# Patient Record
Sex: Male | Born: 1980 | Race: White | Hispanic: No | Marital: Single | Smoking: Current every day smoker
Health system: Southern US, Community
[De-identification: ages and names within clinical notes are randomized; demographics above are authoritative.]

## PROBLEM LIST (undated history)

## (undated) DIAGNOSIS — F419 Anxiety disorder, unspecified: Secondary | ICD-10-CM

## (undated) DIAGNOSIS — F329 Major depressive disorder, single episode, unspecified: Secondary | ICD-10-CM

## (undated) DIAGNOSIS — F32A Depression, unspecified: Secondary | ICD-10-CM

## (undated) HISTORY — PX: SEPTOPLASTY: SUR1290

## (undated) HISTORY — PX: APPENDECTOMY: SHX54

## (undated) HISTORY — PX: RHINOPLASTY: SUR1284

---

## 2017-12-23 ENCOUNTER — Emergency Department (HOSPITAL_COMMUNITY)
Admission: EM | Admit: 2017-12-23 | Discharge: 2017-12-24 | Disposition: A | Discharge status: Home / Self Care | Payer: 59 | Attending: Emergency Medicine | Admitting: Emergency Medicine

## 2017-12-23 ENCOUNTER — Other Ambulatory Visit: Payer: Self-pay

## 2017-12-23 ENCOUNTER — Emergency Department (HOSPITAL_COMMUNITY): Payer: 59

## 2017-12-23 ENCOUNTER — Encounter (HOSPITAL_COMMUNITY): Payer: Self-pay | Admitting: Emergency Medicine

## 2017-12-23 DIAGNOSIS — E876 Hypokalemia: Secondary | ICD-10-CM

## 2017-12-23 DIAGNOSIS — R55 Syncope and collapse: Secondary | ICD-10-CM | POA: Insufficient documentation

## 2017-12-23 DIAGNOSIS — R0781 Pleurodynia: Secondary | ICD-10-CM | POA: Diagnosis not present

## 2017-12-23 DIAGNOSIS — F17210 Nicotine dependence, cigarettes, uncomplicated: Secondary | ICD-10-CM | POA: Insufficient documentation

## 2017-12-23 HISTORY — DX: Major depressive disorder, single episode, unspecified: F32.9

## 2017-12-23 HISTORY — DX: Anxiety disorder, unspecified: F41.9

## 2017-12-23 HISTORY — DX: Depression, unspecified: F32.A

## 2017-12-23 LAB — CBC WITH DIFFERENTIAL/PLATELET
ABS IMMATURE GRANULOCYTES: 0.06 10*3/uL (ref 0.00–0.07)
BASOS ABS: 0 10*3/uL (ref 0.0–0.1)
Basophils Relative: 0 %
EOS ABS: 0.1 10*3/uL (ref 0.0–0.5)
Eosinophils Relative: 1 %
HEMATOCRIT: 44.7 % (ref 39.0–52.0)
HEMOGLOBIN: 15 g/dL (ref 13.0–17.0)
IMMATURE GRANULOCYTES: 1 %
LYMPHS ABS: 3 10*3/uL (ref 0.7–4.0)
LYMPHS PCT: 29 %
MCH: 29.1 pg (ref 26.0–34.0)
MCHC: 33.6 g/dL (ref 30.0–36.0)
MCV: 86.8 fL (ref 80.0–100.0)
MONOS PCT: 5 %
Monocytes Absolute: 0.5 10*3/uL (ref 0.1–1.0)
NEUTROS ABS: 6.8 10*3/uL (ref 1.7–7.7)
NEUTROS PCT: 64 %
NRBC: 0 % (ref 0.0–0.2)
Platelets: 320 10*3/uL (ref 150–400)
RBC: 5.15 MIL/uL (ref 4.22–5.81)
RDW: 12.5 % (ref 11.5–15.5)
WBC: 10.5 10*3/uL (ref 4.0–10.5)

## 2017-12-23 LAB — BASIC METABOLIC PANEL
ANION GAP: 10 (ref 5–15)
BUN: 12 mg/dL (ref 6–20)
CALCIUM: 9.2 mg/dL (ref 8.9–10.3)
CO2: 22 mmol/L (ref 22–32)
Chloride: 102 mmol/L (ref 98–111)
Creatinine, Ser: 1.16 mg/dL (ref 0.61–1.24)
GFR calc non Af Amer: >60 mL/min (ref >60)
GLUCOSE: 102 mg/dL — AB (ref 70–99)
POTASSIUM: 2.8 mmol/L — AB (ref 3.5–5.1)
Sodium: 134 mmol/L — ABNORMAL LOW (ref 135–145)

## 2017-12-23 NOTE — ED Provider Notes (Signed)
Patient placed in Quick Look pathway, seen and evaluated   Chief Complaint: shortness of breath, near syncopal episode  HPI:  Geoffrey Russell is a 37 y.o. male who presents to the ED with shortness of breath that occurred while at work just prior to arrival to the ED. Patient reports that when leaving work he felt like he was going to pass out. Patient has had URI and was evaluated at [facility] a few days ago. Patient has been taking sudafed and using an inhaler.  ROS: Resp: shortness of breath  Neuro: dizziness  CV: chest pain Physical Exam:  BP (!) 158/108 (BP Location: Right Arm)   Pulse 76   Temp 98.2 F (36.8 C) (Oral)   Resp 20   Ht 5\' 10"  (1.778 m)   Wt 93 kg   SpO2 100%   BMI 29.41 kg/m    Gen: No distress  Neuro: Awake and Alert  Skin: Warm and dry  Lungs: no wheezing or rales  Heart: regular rate and rhythm   Initiation of care has begun. The patient has been counseled on the process, plan, and necessity for staying for the completion/evaluation, and the remainder of the medical screening examination    Janne Napoleon, NP 12/23/17 6213    Gwyneth Sprout, MD 12/24/17 1429

## 2017-12-23 NOTE — ED Triage Notes (Signed)
Patient with chest tightness and shortness of breath that started as he was leaving work today.  Patient states that he felt like he was going to pass out at one point after using his inhaler.  Patient recently treated for URI at an [facility]. [facility]).

## 2017-12-24 ENCOUNTER — Emergency Department (HOSPITAL_COMMUNITY): Payer: 59

## 2017-12-24 DIAGNOSIS — R55 Syncope and collapse: Secondary | ICD-10-CM | POA: Diagnosis not present

## 2017-12-24 LAB — TROPONIN I

## 2017-12-24 LAB — D-DIMER, QUANTITATIVE (NOT AT ARMC)

## 2017-12-24 MED ORDER — FLUTICASONE PROPIONATE 50 MCG/ACT NA SUSP: 2.0000 | Freq: Every day | NASAL | 0 refills | Status: AC

## 2017-12-24 MED ORDER — POTASSIUM CHLORIDE CRYS ER 20 MEQ PO TBCR: 20.0000 meq | EXTENDED_RELEASE_TABLET | Freq: Two times a day (BID) | ORAL | 0 refills | Status: AC

## 2017-12-24 MED ORDER — POTASSIUM CHLORIDE CRYS ER 20 MEQ PO TBCR
40.0000 meq | EXTENDED_RELEASE_TABLET | Freq: Once | ORAL | Status: AC
  Administered 2017-12-24: 40 meq via ORAL
  Filled 2017-12-24: qty 2

## 2017-12-24 MED ORDER — SODIUM CHLORIDE 0.9 % IV BOLUS
1000.0000 mL | Freq: Once | INTRAVENOUS | Status: AC
  Administered 2017-12-24: 1000 mL via INTRAVENOUS

## 2017-12-24 MED ORDER — IOPAMIDOL (ISOVUE-370) INJECTION 76%
100.0000 mL | Freq: Once | INTRAVENOUS | Status: AC | PRN
  Administered 2017-12-24: 75 mL via INTRAVENOUS

## 2017-12-24 NOTE — ED Notes (Signed)
ED Provider at bedside. 

## 2017-12-24 NOTE — ED Provider Notes (Signed)
[facility] EMERGENCY DEPARTMENT Provider Note   CSN: [id number] Arrival date & time: 12/23/17  1934     History   Chief Complaint Chief Complaint  Patient presents with  . Shortness of Breath  . Near Syncope    HPI Geoffrey Russell is a 37 y.o. male.  37 year old with no past medical history presents with episode of near syncope happened after he got off work around 7 PM.  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  States he might of inhaled some fumes today.  He did not eat and drink very well today.  He has been sick with a URI and was diagnosed with this at urgent care about 5 days ago.  He was treated with albuterol and prednisone which she has finished and is still taking Sudafed.  He does have a history of smoking but no diagnosis of asthma.  Admits to poor p.o. intake without vomiting or diarrhea.  States when he tried to get up he felt his heart racing and felt like he is going to pass out and had a sit back down.  This happened twice.  He does have pleuritic chest pain with breathing and nonproductive cough.  No fever.  He admits that he has not been drinking very much fluids over the past several days.  He did not get hot or sweaty.  Did not feel like he was going throw up.  He states he had blurry vision and racing heart and lightheadedness and had to sit down. He is anxious because his fiance died in this hospital 6 months ago.  The history is provided by the patient.  Shortness of Breath  Associated symptoms include rhinorrhea. Pertinent negatives include no fever, no headaches, no cough, no chest pain, no vomiting, no abdominal pain and no rash.  Near Syncope  Associated symptoms include shortness of breath. Pertinent negatives include no chest pain, no abdominal pain and no headaches.    Past Medical History:  Diagnosis Date  . Anxiety   . Depression     There are no active  problems to display for this patient.   Past Surgical History:  Procedure Laterality Date  . APPENDECTOMY    . RHINOPLASTY    . SEPTOPLASTY          Home Medications    Prior to Admission medications   Medication Sig Start Date End Date Taking? Authorizing Provider  brompheniramine-pseudoephedrine-DM 30-2-10 MG/5ML syrup Take 10 mLs by mouth every 6 (six) hours as needed (for cough).  12/18/17  Yes [provider]  predniSONE (DELTASONE) 20 MG tablet Take 40 mg by mouth daily. 12/18/17  Yes [provider]  PROAIR HFA 108 (90 Base) MCG/ACT inhaler Inhale 1-2 puffs into the lungs every 4 (four) hours as needed for wheezing.  12/18/17  Yes [provider]    Family History No family history on file.  Social History Social History   Tobacco Use  . Smoking status: Current Every Day Smoker  . Smokeless tobacco: Never Used  Substance Use Topics  . Alcohol use: Not on file  . Drug use: Never     Allergies   Patient has no known allergies.   Review of Systems Review of Systems  Constitutional: Negative for activity change, appetite change, fatigue and fever.  HENT: Positive for congestion and rhinorrhea. Negative for trouble swallowing.   Respiratory: Positive for chest tightness and shortness of breath. Negative  for cough.   Cardiovascular: Positive for near-syncope. Negative for chest pain.  Gastrointestinal: Positive for nausea. Negative for abdominal pain and vomiting.  Genitourinary: Negative for decreased urine volume, dysuria and hematuria.  Musculoskeletal: Negative for arthralgias and myalgias.  Skin: Negative for rash.  Neurological: Positive for dizziness and light-headedness. Negative for weakness and headaches.   all other systems are negative except as noted in the HPI and PMH.     Physical Exam Updated Vital Signs BP 135/90 (BP Location: Right Arm)   Pulse (!) 53   Temp 97.6 F (36.4 C) (Oral)   Resp 16   Ht 5\' 10"   (1.778 m)   Wt 93 kg   SpO2 100%   BMI 29.41 kg/m   Physical Exam  Constitutional: He is oriented to person, place, and time. He appears well-developed and well-nourished. No distress.  Appears anxious No respiratory distress  HENT:  Head: Normocephalic and atraumatic.  Mouth/Throat: Oropharynx is clear and moist. No oropharyngeal exudate.  Eyes: Pupils are equal, round, and reactive to light. Conjunctivae and EOM are normal.  Neck: Normal range of motion. Neck supple.  No meningismus.  Cardiovascular: Normal rate, regular rhythm, normal heart sounds and intact distal pulses.  No murmur heard. Pulmonary/Chest: Effort normal and breath sounds normal. No respiratory distress. He has no wheezes.  Abdominal: Soft. There is no tenderness. There is no rebound and no guarding.  Musculoskeletal: Normal range of motion. He exhibits no edema or tenderness.  Neurological: He is alert and oriented to person, place, and time. No cranial nerve deficit. He exhibits normal muscle tone. Coordination normal.   5/5 strength throughout. CN 2-12 intact.Equal grip strength.   Skin: Skin is warm.  Psychiatric: He has a normal mood and affect. His behavior is normal.  Nursing note and vitals reviewed.    ED Treatments / Results  Labs (all labs ordered are listed, but only abnormal results are displayed) Labs Reviewed  BASIC METABOLIC PANEL - Abnormal; Notable for the following components:      Result Value   Sodium 134 (*)    Potassium 2.8 (*)    Glucose, Bld 102 (*)    All other components within normal limits  CBC WITH DIFFERENTIAL/PLATELET  D-DIMER, QUANTITATIVE (NOT AT [facility])  TROPONIN I    EKG EKG Interpretation  Date/Time:  Monday December 23 2017 19:38:13 EDT Ventricular Rate:  80 PR Interval:  140 QRS Duration: 88 QT Interval:  368 QTC Calculation: 424 R Axis:   53 Text Interpretation:  Normal sinus rhythm Normal ECG No old tracing to compare Confirmed by Glynn Octave [phone])  on 12/24/2017 4:13:16 AM   Radiology Dg Chest 2 View  Result Date: 12/23/2017 CLINICAL DATA:  Shortness of breath EXAM: CHEST - 2 VIEW COMPARISON:  07/31/2016 FINDINGS: Heart and mediastinal contours are within normal limits. No focal opacities or effusions. No acute bony abnormality. IMPRESSION: No active cardiopulmonary disease. Electronically Signed   By: Charlett Nose M.D.   On: 12/23/2017 20:12    Procedures Procedures (including critical care time)  Medications Ordered in ED Medications  potassium chloride SA (K-DUR,KLOR-CON) CR tablet 40 mEq (has no administration in time range)     Initial Impression / Assessment and Plan / ED Course  I have reviewed the triage vital signs and the nursing notes.  Pertinent labs & imaging results that were available during my care of the patient were reviewed by me and considered in my medical decision making (see chart for details).  Near syncope with pleuritic chest pain and shortness of breath.  No hypoxia, clear lungs.  EKG shows sinus rhythm, no Brugada, no prolonged QT.  Chest x-ray is negative.  He is not wheezing on exam.  We will check orthostatics, p.o. hydration, d-dimer given pleuritic chest pain  Orthostatics negative.  Patient given p.o. and IV hydration.  Troponin d-dimer negative.  Low suspicion for ACS.  Given patient's anxiety as well as ongoing pleuritic chest pain will obtain CT scan to fully rule out pulmonary embolism.  Patient will be treated supportively for likely viral URI with pleuritic type pain.  His near syncope is likely due to his poor p.o. intake as well as fume exposure as well as recent sympathomimetic use.  Patient transferred to Dr. Rodena Medin at shift change. Final Clinical Impressions(s) / ED Diagnoses   Final diagnoses:  Near syncope    ED Discharge Orders    None       Robben Jagiello, Jeannett Senior, MD 12/24/17 [phone]

## 2017-12-24 NOTE — Discharge Instructions (Addendum)
There is no evidence of pneumonia or blood clot in the lung. Keep yourself hydrated.  Follow-up with your primary doctor.  Return to the ED if you develop chest pain, shortness of breath, any other concerns.

## 2019-05-10 IMAGING — CT CT ANGIO CHEST
2 of 10 series · 16 of 46 positions shown · IV contrast (APPLIED)
Comparison: Chest radiograph December 23, 2017

CLINICAL DATA: Shortness of breath

EXAM:
CT ANGIOGRAPHY CHEST WITH CONTRAST
TECHNIQUE: Multidetector CT imaging of the chest was performed using the
standard protocol during bolus administration of intravenous
contrast. Multiplanar CT image reconstructions and MIPs were
obtained to evaluate the vascular anatomy.
CONTRAST:  75 mL IXVLLG-5OE IOPAMIDOL (IXVLLG-5OE) INJECTION 76%

[Series 8: thins · axial · 0.72mm/px · z∈[+1212,+1430]mm · 13 of 366 slices shown]
[im 27/366  lung]
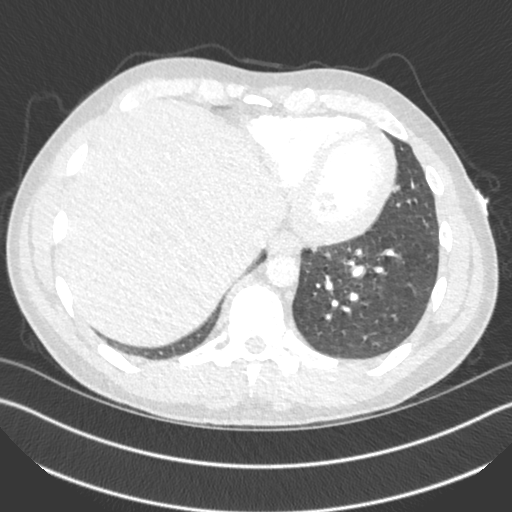
[im 53/366  soft-tissue]
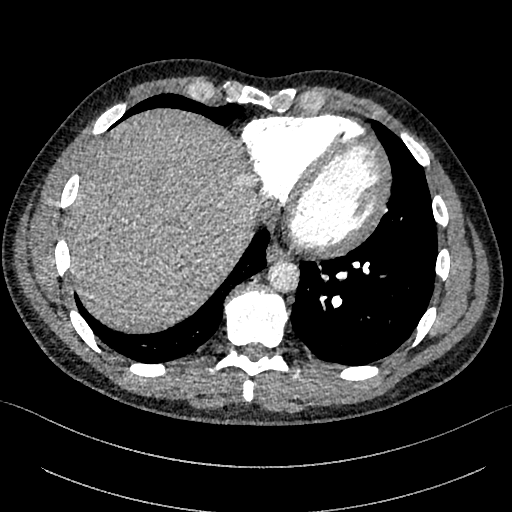
[im 79/366  lung]
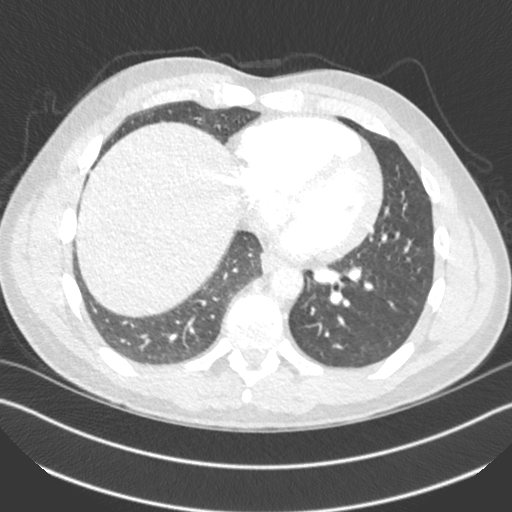
[im 105/366  soft-tissue]
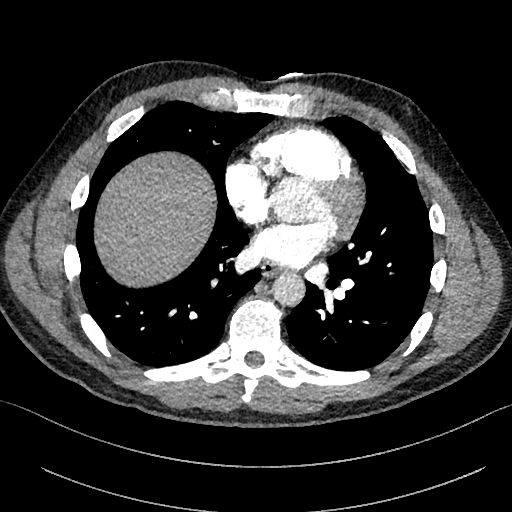
[im 131/366  lung]
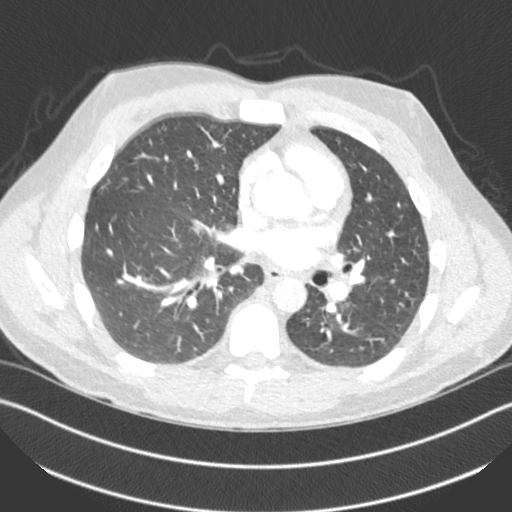
[im 157/366  soft-tissue]
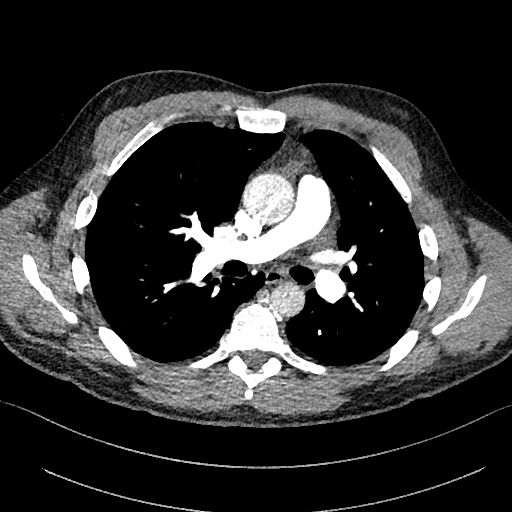
[im 183/366  lung]
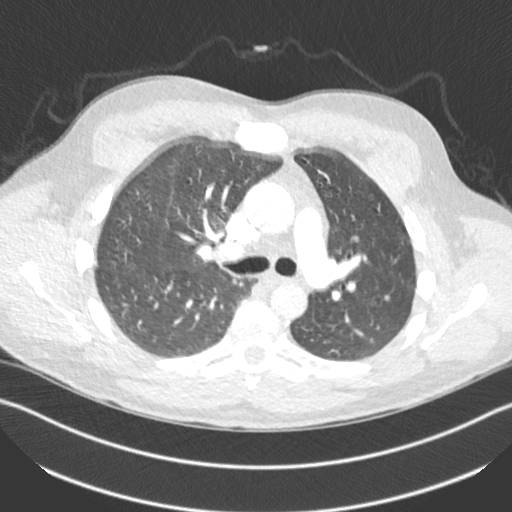
[im 209/366  soft-tissue]
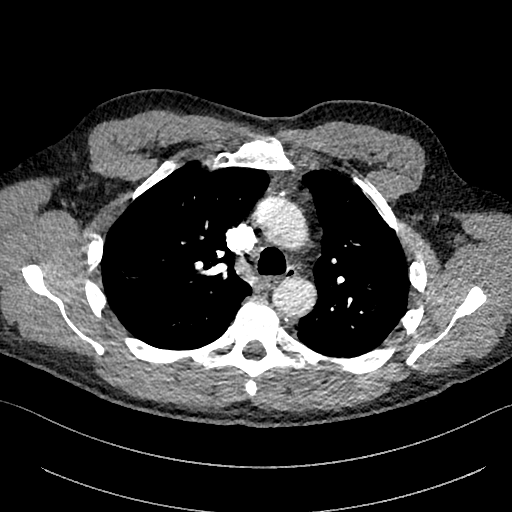
[im 235/366  lung]
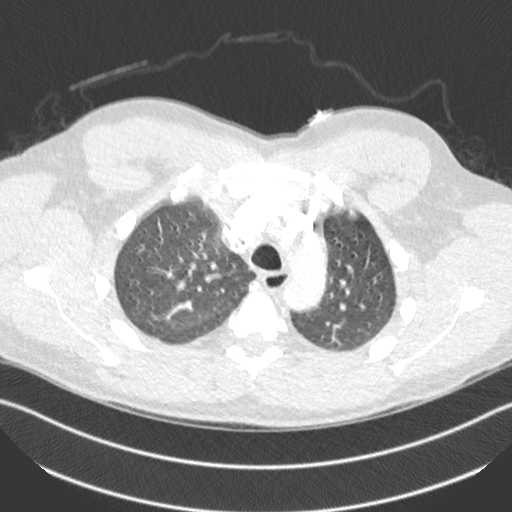
[im 261/366  soft-tissue]
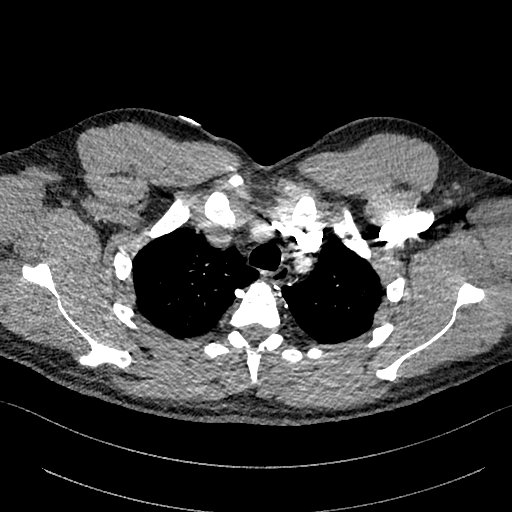
[im 287/366  lung]
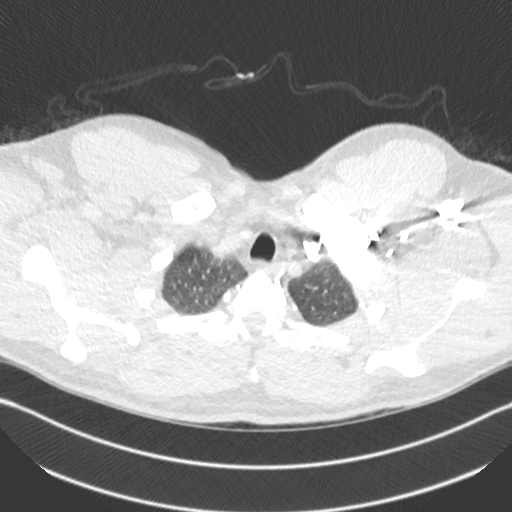
[im 313/366  soft-tissue]
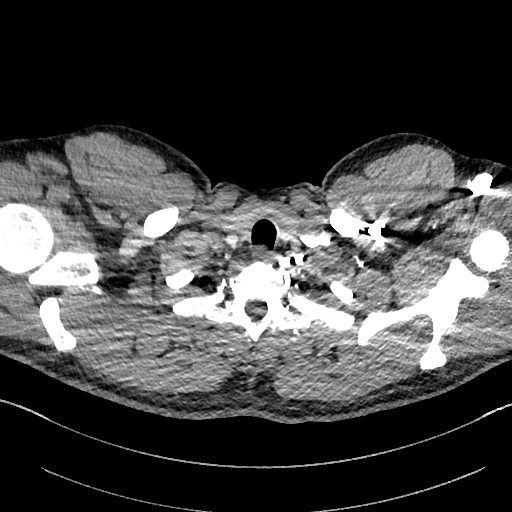
[im 339/366  lung]
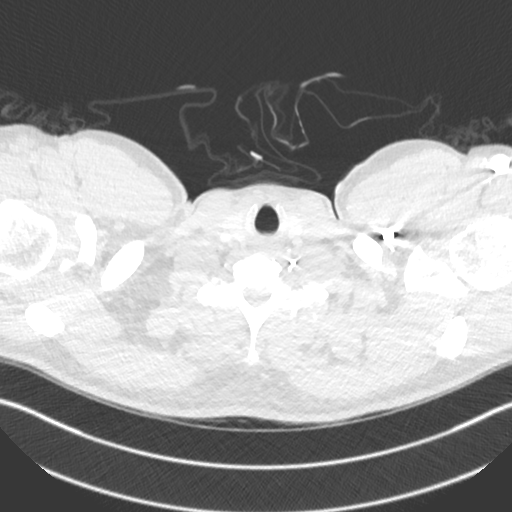

[Series 12: cor · coronal · 0.48mm/px · 3 of 147 slices shown]
[im 37/147  soft-tissue]
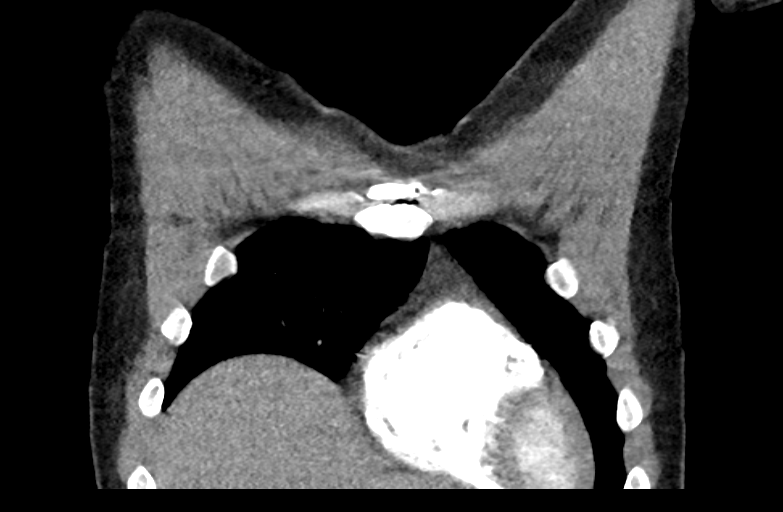
[im 74/147  soft-tissue]
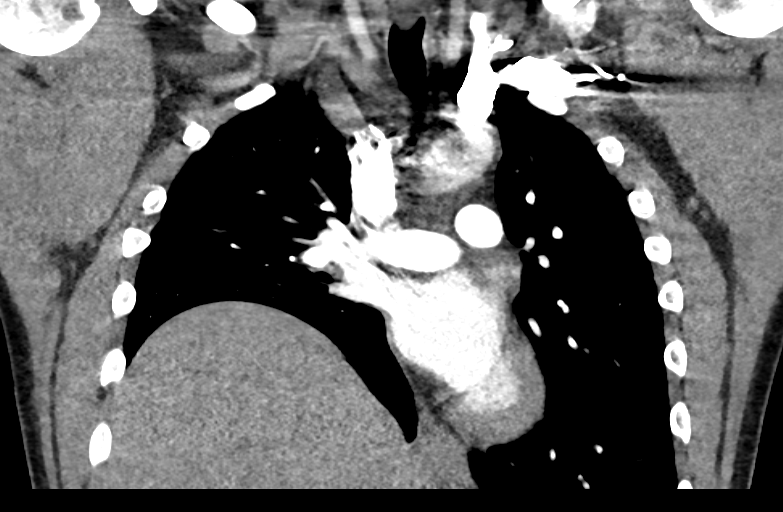
[im 110/147  soft-tissue]
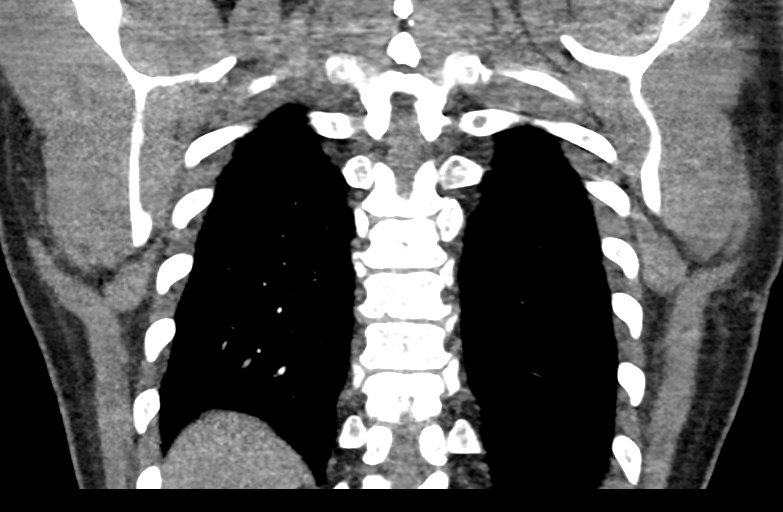

[16 of 46 positions shown; findings below may reference images not displayed]

FINDINGS: Cardiovascular: A portion the left base is not well visualized due
to technical factors. Given this limitation, no pulmonary embolus is
evident. There is no thoracic aortic aneurysm or dissection.
Visualized great vessels appear unremarkable. No pericardial
effusion or pericardial thickening is evident.

Mediastinum/Nodes: Thyroid appears unremarkable. There are several
subcentimeter mediastinal lymph nodes. There is a lymph node
adjacent to the aortic arch on the left measuring 1.6 x 1.3 cm. No
other lymph node enlargement is appreciable. There is a small hiatal
hernia.

Lungs/Pleura: A portion of the left base is not completely imaged on
this study. Given this limitation, no edema or consolidation is
evident. No pleural effusion or pleural thickening is appreciable.

Upper Abdomen: Visualized upper abdominal structures appear
unremarkable.

Musculoskeletal: No blastic or lytic bone lesions are evident. No
chest wall lesions appreciable.

Review of the MIP images confirms the above findings.
IMPRESSION: 1. Note that a portion of the left base is not visualized on this
study. Given this limitation, there is no evident pulmonary embolus.
There is no thoracic aortic aneurysm or dissection.

2. The visualized lung regions are clear. A portion of the left base
is not appreciable.

3. Prominent lymph node just to the left of the aortic arch. No
other lymph node prominence evident. Etiology for this single
prominent lymph node uncertain.

4.  Small hiatal hernia.
